# Patient Record
Sex: Male | Born: 2000 | Race: White | Hispanic: No | Marital: Single | Smoking: Current some day smoker
Health system: Southern US, Community
[De-identification: ages and names within clinical notes are randomized; demographics above are authoritative.]

## PROBLEM LIST (undated history)

## (undated) DIAGNOSIS — J302 Other seasonal allergic rhinitis: Secondary | ICD-10-CM

---
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
## 2018-09-24 ENCOUNTER — Emergency Department (HOSPITAL_COMMUNITY)
Admission: EM | Admit: 2018-09-24 | Discharge: 2018-09-24 | Disposition: A | Discharge status: Home / Self Care | Payer: BLUE CROSS/BLUE SHIELD | Attending: Emergency Medicine | Admitting: Emergency Medicine

## 2018-09-24 ENCOUNTER — Other Ambulatory Visit: Payer: Self-pay

## 2018-09-24 ENCOUNTER — Encounter (HOSPITAL_COMMUNITY): Payer: Self-pay | Admitting: Emergency Medicine

## 2018-09-24 DIAGNOSIS — Z23 Encounter for immunization: Secondary | ICD-10-CM | POA: Diagnosis not present

## 2018-09-24 DIAGNOSIS — X58XXXA Exposure to other specified factors, initial encounter: Secondary | ICD-10-CM | POA: Insufficient documentation

## 2018-09-24 DIAGNOSIS — T1592XA Foreign body on external eye, part unspecified, left eye, initial encounter: Secondary | ICD-10-CM | POA: Diagnosis present

## 2018-09-24 DIAGNOSIS — Z1810 Retained metal fragments, unspecified: Secondary | ICD-10-CM | POA: Insufficient documentation

## 2018-09-24 DIAGNOSIS — Y929 Unspecified place or not applicable: Secondary | ICD-10-CM | POA: Insufficient documentation

## 2018-09-24 DIAGNOSIS — Y999 Unspecified external cause status: Secondary | ICD-10-CM | POA: Insufficient documentation

## 2018-09-24 DIAGNOSIS — Z79899 Other long term (current) drug therapy: Secondary | ICD-10-CM | POA: Diagnosis not present

## 2018-09-24 DIAGNOSIS — Y9389 Activity, other specified: Secondary | ICD-10-CM | POA: Insufficient documentation

## 2018-09-24 MED ORDER — HYDROCODONE-ACETAMINOPHEN 5-325 MG PO TABS: 1.0000 | ORAL_TABLET | ORAL | 0 refills | Status: DC | PRN

## 2018-09-24 MED ORDER — FLUORESCEIN SODIUM 1 MG OP STRP
1.0000 | ORAL_STRIP | Freq: Once | OPHTHALMIC | Status: AC
  Administered 2018-09-24: 1 via OPHTHALMIC
  Filled 2018-09-24: qty 1

## 2018-09-24 MED ORDER — TETANUS-DIPHTH-ACELL PERTUSSIS 5-2.5-18.5 LF-MCG/0.5 IM SUSP
0.5000 mL | Freq: Once | INTRAMUSCULAR | Status: AC
  Administered 2018-09-24: 0.5 mL via INTRAMUSCULAR
  Filled 2018-09-24: qty 0.5

## 2018-09-24 MED ORDER — TETRACAINE HCL 0.5 % OP SOLN
2.0000 [drp] | Freq: Once | OPHTHALMIC | Status: AC
  Administered 2018-09-24: 2 [drp] via OPHTHALMIC
  Filled 2018-09-24: qty 4

## 2018-09-24 MED ORDER — TOBRAMYCIN 0.3 % OP SOLN: 1.0000 [drp] | OPHTHALMIC | 0 refills | Status: AC

## 2018-09-24 NOTE — ED Provider Notes (Signed)
[facility] EMERGENCY DEPARTMENT Provider Note   CSN: [id number] Arrival date & time: 09/24/18  1405     History   Chief Complaint Chief Complaint  Patient presents with  . Foreign Body in Eye    HPI Jason Wilkerson is a 18 y.o. male.  The history is provided by the patient. No language interpreter was used.  Foreign Body in Eye  This is a new problem. The problem occurs constantly. The problem has not changed since onset.Nothing aggravates the symptoms. Nothing relieves the symptoms. He has tried nothing for the symptoms. The treatment provided no relief.    Past Medical History:  Diagnosis Date  . Seasonal allergies     Patient Active Problem List   Diagnosis Date Noted  . Abnormal spirometry 10/06/2015  . Seasonal and perennial allergic rhinoconjunctivitis 05/15/2015    History reviewed. No pertinent surgical history.      Home Medications    Prior to Admission medications   Medication Sig Start Date End Date Taking? Authorizing Provider  Albuterol Sulfate (PROAIR RESPICLICK) 108 (90 Base) MCG/ACT AEPB Inhale 2 puffs into the lungs as needed.    [provider]  azelastine (ASTELIN) 0.1 % nasal spray Place 2 sprays into both nostrils daily. Use in each nostril as directed 10/06/15   Bobbitt, Heywood Iles, MD  Beclomethasone Dipropionate (QNASL) 80 MCG/ACT AERS Use 1 spray per nostril twice daily as needed 11/17/15   Bobbitt, Heywood Iles, MD  cetirizine (ZYRTEC) 10 MG tablet Take 10 mg by mouth daily. Reported on 11/17/2015    [provider]  diphenhydrAMINE (BENADRYL) 25 mg capsule Take 25 mg by mouth at bedtime as needed. Reported on 11/17/2015    [provider]  fluticasone (FLONASE) 50 MCG/ACT nasal spray Place 1 spray into both nostrils 2 (two) times daily. Reported on 11/17/2015    [provider]  HYDROcodone-acetaminophen (NORCO/VICODIN) 5-325 MG tablet Take 1 tablet by mouth every 4 (four) hours as needed. 09/24/18   Elson Areas, PA-C  ibuprofen (ADVIL,MOTRIN) 600 MG tablet Take 1 tablet (600 mg total) by mouth every 6 (six) hours as needed. 02/11/15   Ivery Quale, PA-C  levocetirizine (XYZAL) 5 MG tablet TAKE 1 TABLET BY MOUTH ONCE DAILY FOR RUNNY NOSE OR ITCHING 01/28/16   Bobbitt, Heywood Iles, MD  montelukast (SINGULAIR) 10 MG tablet Take 1 tablet (10 mg total) by mouth at bedtime. 10/06/15   Bobbitt, Heywood Iles, MD  tobramycin (TOBREX) 0.3 % ophthalmic solution Place 1 drop into the right eye every 4 (four) hours for 10 days. 09/24/18 10/04/18  Elson Areas, PA-C    Family History History reviewed. No pertinent family history.  Social History Social History   Tobacco Use  . Smoking status: Never Smoker  . Smokeless tobacco: Current User    Types: Snuff  Substance Use Topics  . Alcohol use: No    Alcohol/week: 0.0 standard drinks  . Drug use: No     Allergies   Augmentin [amoxicillin-pot clavulanate]   Review of Systems Review of Systems  All other systems reviewed and are negative.    Physical Exam Updated Vital Signs BP 138/88   Pulse 80   Temp 97.9 F (36.6 C) (Oral)   Resp 16   Ht 6' (1.829 m)   Wt 122.5 kg   SpO2 99%   BMI 36.62 kg/m   Physical Exam Vitals signs and nursing note reviewed.  Constitutional:      Appearance: He is well-developed.  HENT:     Head: Normocephalic.     Nose: Nose normal.     Mouth/Throat:     Mouth: Mucous membranes are moist.  Eyes:     Extraocular Movements: Extraocular movements intact.     Pupils: Pupils are equal, round, and reactive to light.     Comments: Small embedded foreign body 9oclock left eye    Neck:     Musculoskeletal: Normal range of motion.  Pulmonary:     Effort: Pulmonary effort is normal.  Abdominal:     General: There is no distension.  Musculoskeletal: Normal range of motion.  Neurological:     Mental Status: He is alert and oriented to person, place, and time.   Procedure tetracaine, fluro no uptake,    I attempted removal with qtip and 20 gauge.   I was unable to remove.   I spoke to Dr. Sherryll BurgerShah Opthomologist who will see tommorow at 7:30 am.    ED Treatments / Results  Labs (all labs ordered are listed, but only abnormal results are displayed) Labs Reviewed - No data to display  EKG None  Radiology No results found.  Procedures Procedures (including critical care time)  Medications Ordered in ED Medications  fluorescein ophthalmic strip 1 strip (has no administration in time range)  tetracaine (PONTOCAINE) 0.5 % ophthalmic solution 2 drop (2 drops Left Eye Given 09/24/18 1526)     Initial Impression / Assessment and Plan / ED Course  I have reviewed the triage vital signs and the nursing notes.  Pertinent labs & imaging results that were available during my care of the patient were reviewed by me and considered in my medical decision making (see chart for details).       Final Clinical Impressions(s) / ED Diagnoses   Final diagnoses:  Foreign body of left eye, initial encounter    ED Discharge Orders         Ordered    HYDROcodone-acetaminophen (NORCO/VICODIN) 5-325 MG tablet  Every 4 hours PRN     09/24/18 1637    tobramycin (TOBREX) 0.3 % ophthalmic solution  Every 4 hours     09/24/18 1637        An After Visit Summary was printed and given to the patient.    Elson AreasSofia,  K, [place]PA-C 09/24/18 1643    Eber HongMiller, Brian, MD 09/26/18 [phone]

## 2018-09-24 NOTE — ED Triage Notes (Signed)
Pt c/o of a piece of metal to his left eye.  Was working with metal this week, was not wearing goggles and felt something in his eye after.    Redness and pain noted to eye

## 2018-09-24 NOTE — Discharge Instructions (Signed)
See the Eye doctor tomorrow morning

## 2020-09-16 NOTE — Progress Notes (Unsigned)
[facility] [address] [place], [place] [address]  Pulmonary Sleep Medicine   Office Visit Note  Patient Name: Jason Wilkerson DOB: [birth year] MRN [id number]    Chief Complaint: Obstructive Sleep Apnea visit  Brief History:  Jason Wilkerson is seen today for *** The patient has a *** history of sleep apnea. Patient *** using PAP nightly.  The patient feels *** after sleeping with PAP.  The patient reports *** from PAP use. Reported sleepiness is  *** and the Epworth Sleepiness Score is *** out of 24. The patient *** take naps. The patient complains of the following: ***  The compliance download shows  compliance with an average use time of *** hours. The AHI is ***  The patient *** of limb movements disrupting sleep.  ROS  General: (-) fever, (-) chills, (-) night sweat Nose and Sinuses: (-) nasal stuffiness or itchiness, (-) postnasal drip, (-) nosebleeds, (-) sinus trouble. Mouth and Throat: (-) sore throat, (-) hoarseness. Neck: (-) swollen glands, (-) enlarged thyroid, (-) neck pain. Respiratory: *** cough, *** shortness of breath, *** wheezing. Neurologic: *** numbness, *** tingling. Psychiatric: *** anxiety, *** depression   Current Medication: Outpatient Encounter Medications as of 09/17/2020  Medication Sig  . Albuterol Sulfate (PROAIR RESPICLICK) 108 (90 Base) MCG/ACT AEPB Inhale 2 puffs into the lungs as needed.  Marland Kitchen azelastine (ASTELIN) 0.1 % nasal spray Place 2 sprays into both nostrils daily. Use in each nostril as directed  . Beclomethasone Dipropionate (QNASL) 80 MCG/ACT AERS Use 1 spray per nostril twice daily as needed  . cetirizine (ZYRTEC) 10 MG tablet Take 10 mg by mouth daily. Reported on 11/17/2015  . diphenhydrAMINE (BENADRYL) 25 mg capsule Take 25 mg by mouth at bedtime as needed. Reported on 11/17/2015  . fluticasone (FLONASE) 50 MCG/ACT nasal spray Place 1 spray into both nostrils 2 (two) times daily. Reported on 11/17/2015  . HYDROcodone-acetaminophen  (NORCO/VICODIN) 5-325 MG tablet Take 1 tablet by mouth every 4 (four) hours as needed.  Marland Kitchen ibuprofen (ADVIL,MOTRIN) 600 MG tablet Take 1 tablet (600 mg total) by mouth every 6 (six) hours as needed.  Marland Kitchen levocetirizine (XYZAL) 5 MG tablet TAKE 1 TABLET BY MOUTH ONCE DAILY FOR RUNNY NOSE OR ITCHING  . montelukast (SINGULAIR) 10 MG tablet Take 1 tablet (10 mg total) by mouth at bedtime.   No facility-administered encounter medications on file as of 09/17/2020.    Surgical History: No past surgical history on file.  Medical History: Past Medical History:  Diagnosis Date  . Seasonal allergies     Family History: Non contributory to the present illness  Social History: Social History   Socioeconomic History  . Marital status: Single    Spouse name: Not on file  . Number of children: Not on file  . Years of education: Not on file  . Highest education level: Not on file  Occupational History  . Not on file  Tobacco Use  . Smoking status: Never Smoker  . Smokeless tobacco: Current User    Types: Snuff  Substance and Sexual Activity  . Alcohol use: No    Alcohol/week: 0.0 standard drinks  . Drug use: No  . Sexual activity: Not on file  Other Topics Concern  . Not on file  Social History Narrative  . Not on file   Social Determinants of Health   Financial Resource Strain: Not on file  Food Insecurity: Not on file  Transportation Needs: Not on file  Physical Activity: Not on file  Stress: Not on file  Social Connections: Not on file  Intimate Partner Violence: Not on file    Vital Signs: There were no vitals taken for this visit.  Examination: General Appearance: The patient is well-developed, well-nourished, and in no distress. Neck Circumference: *** Skin: Gross inspection of skin unremarkable. Head: normocephalic, no gross deformities. Eyes: no gross deformities noted. ENT: ears appear grossly normal Neurologic: Alert and oriented. No involuntary  movements.    EPWORTH SLEEPINESS SCALE:  Scale:  (0)= no chance of dozing; (1)= slight chance of dozing; (2)= moderate chance of dozing; (3)= high chance of dozing  Chance  Situtation    Sitting and reading: ***    Watching TV: ***    Sitting Inactive in public: ***    As a passenger in car: ***      Lying down to rest: ***    Sitting and talking: ***    Sitting quielty after lunch: ***    In a car, stopped in traffic: ***   TOTAL SCORE:   *** out of 24    SLEEP STUDIES:  1. HST 12/09/09 AHI 23 Spo87min 96%  CPAP COMPLIANCE DATA:  Date Range: ***  Average Daily Use: *** hours  Median Use: ***  Compliance for > 4 Hours: *** days  AHI: *** respiratory events per hour  Days Used: ***  Mask Leak: ***  95th Percentile Pressure: ***         LABS: No results found for this or any previous visit (from the past 2160 hour(s)).  Radiology: No results found.  No results found.  No results found.    Assessment and Plan: Patient Active Problem List   Diagnosis Date Noted  . Abnormal spirometry 10/06/2015  . Seasonal and perennial allergic rhinoconjunctivitis 05/15/2015      The patient *** tolerate PAP and reports *** benefit from PAP use. The patient was reminded how to *** and advised to ***. The patient was also counselled on ***. The compliance is ***. The AHI is ***.   1. ***  General Counseling: I have discussed the findings of the evaluation and examination with Jason Wilkerson.  I have also discussed any further diagnostic evaluation thatmay be needed or ordered today. Jason Wilkerson verbalizes understanding of the findings of todays visit. We also reviewed his medications today and discussed drug interactions and side effects including but not limited excessive drowsiness and altered mental states. We also discussed that there is always a risk not just to him but also people around him. he has been encouraged to call the office with any questions or  concerns that should arise related to todays visit.  No orders of the defined types were placed in this encounter.       I have personally obtained a history, examined the patient, evaluated laboratory and imaging results, formulated the assessment and plan and placed orders.   Valentino Hue Sol Blazing, PhD, FAASM  Diplomate, American Board of Sleep Medicine    Yevonne Pax, MD [facility] Diplomate ABMS Pulmonary and Critical Care Medicine Sleep medicine

## 2020-09-17 ENCOUNTER — Ambulatory Visit: Payer: BLUE CROSS/BLUE SHIELD

## 2023-05-15 ENCOUNTER — Emergency Department (HOSPITAL_COMMUNITY)
Admission: EM | Admit: 2023-05-15 | Discharge: 2023-05-15 | Disposition: A | Discharge status: Home / Self Care | Payer: 59 | Attending: Emergency Medicine | Admitting: Emergency Medicine

## 2023-05-15 ENCOUNTER — Emergency Department (HOSPITAL_COMMUNITY)
Admission: EM | Admit: 2023-05-15 | Discharge: 2023-05-15 | Disposition: A | Discharge status: Home / Self Care | Payer: 59 | Source: Home / Self Care | Attending: Emergency Medicine | Admitting: Emergency Medicine

## 2023-05-15 ENCOUNTER — Encounter (HOSPITAL_COMMUNITY): Payer: Self-pay

## 2023-05-15 ENCOUNTER — Emergency Department (HOSPITAL_COMMUNITY): Payer: 59

## 2023-05-15 ENCOUNTER — Other Ambulatory Visit: Payer: Self-pay

## 2023-05-15 DIAGNOSIS — S161XXA Strain of muscle, fascia and tendon at neck level, initial encounter: Secondary | ICD-10-CM | POA: Insufficient documentation

## 2023-05-15 DIAGNOSIS — Y905 Blood alcohol level of 100-119 mg/100 ml: Secondary | ICD-10-CM | POA: Insufficient documentation

## 2023-05-15 DIAGNOSIS — D72829 Elevated white blood cell count, unspecified: Secondary | ICD-10-CM | POA: Insufficient documentation

## 2023-05-15 DIAGNOSIS — Z5189 Encounter for other specified aftercare: Secondary | ICD-10-CM | POA: Insufficient documentation

## 2023-05-15 DIAGNOSIS — S20412A Abrasion of left back wall of thorax, initial encounter: Secondary | ICD-10-CM | POA: Insufficient documentation

## 2023-05-15 DIAGNOSIS — R509 Fever, unspecified: Secondary | ICD-10-CM | POA: Diagnosis not present

## 2023-05-15 DIAGNOSIS — S0101XA Laceration without foreign body of scalp, initial encounter: Secondary | ICD-10-CM | POA: Insufficient documentation

## 2023-05-15 DIAGNOSIS — Z1152 Encounter for screening for COVID-19: Secondary | ICD-10-CM | POA: Insufficient documentation

## 2023-05-15 DIAGNOSIS — S0990XA Unspecified injury of head, initial encounter: Secondary | ICD-10-CM | POA: Diagnosis present

## 2023-05-15 LAB — CBC WITH DIFFERENTIAL/PLATELET
Abs Immature Granulocytes: 0.04 10*3/uL (ref 0.00–0.07)
Basophils Absolute: 0 10*3/uL (ref 0.0–0.1)
Basophils Relative: 0 %
Eosinophils Absolute: 0.2 10*3/uL (ref 0.0–0.5)
Eosinophils Relative: 1 %
HCT: 43.4 % (ref 39.0–52.0)
Hemoglobin: 15.1 g/dL (ref 13.0–17.0)
Immature Granulocytes: 0 %
Lymphocytes Relative: 25 %
Lymphs Abs: 3.1 10*3/uL (ref 0.7–4.0)
MCH: 32.3 pg (ref 26.0–34.0)
MCHC: 34.8 g/dL (ref 30.0–36.0)
MCV: 92.9 fL (ref 80.0–100.0)
Monocytes Absolute: 1.2 10*3/uL — ABNORMAL HIGH (ref 0.1–1.0)
Monocytes Relative: 10 %
Neutro Abs: 8 10*3/uL — ABNORMAL HIGH (ref 1.7–7.7)
Neutrophils Relative %: 64 %
Platelets: 247 10*3/uL (ref 150–400)
RBC: 4.67 MIL/uL (ref 4.22–5.81)
RDW: 13 % (ref 11.5–15.5)
WBC: 12.6 10*3/uL — ABNORMAL HIGH (ref 4.0–10.5)
nRBC: 0 % (ref 0.0–0.2)

## 2023-05-15 LAB — COMPREHENSIVE METABOLIC PANEL
ALT: 20 U/L (ref 0–44)
AST: 21 U/L (ref 15–41)
Albumin: 3.9 g/dL (ref 3.5–5.0)
Alkaline Phosphatase: 68 U/L (ref 38–126)
Anion gap: 9 (ref 5–15)
BUN: 15 mg/dL (ref 6–20)
CO2: 23 mmol/L (ref 22–32)
Calcium: 8.9 mg/dL (ref 8.9–10.3)
Chloride: 103 mmol/L (ref 98–111)
Creatinine, Ser: 1.08 mg/dL (ref 0.61–1.24)
GFR, Estimated: >60 mL/min (ref >60)
Glucose, Bld: 113 mg/dL — ABNORMAL HIGH (ref 70–99)
Potassium: 3.8 mmol/L (ref 3.5–5.1)
Sodium: 135 mmol/L (ref 135–145)
Total Bilirubin: 0.9 mg/dL (ref 0.3–1.2)
Total Protein: 7.3 g/dL (ref 6.5–8.1)

## 2023-05-15 LAB — URINALYSIS, ROUTINE W REFLEX MICROSCOPIC
Bacteria, UA: NONE SEEN
Bilirubin Urine: NEGATIVE
Glucose, UA: NEGATIVE mg/dL
Hgb urine dipstick: NEGATIVE
Ketones, ur: 5 mg/dL — AB
Leukocytes,Ua: NEGATIVE
Nitrite: NEGATIVE
Protein, ur: 30 mg/dL — AB
Specific Gravity, Urine: 1.034 — ABNORMAL HIGH (ref 1.005–1.030)
pH: 6 (ref 5.0–8.0)

## 2023-05-15 LAB — CBC
HCT: 42.2 % (ref 39.0–52.0)
Hemoglobin: 14.3 g/dL (ref 13.0–17.0)
MCH: 31.6 pg (ref 26.0–34.0)
MCHC: 33.9 g/dL (ref 30.0–36.0)
MCV: 93.4 fL (ref 80.0–100.0)
Platelets: 238 10*3/uL (ref 150–400)
RBC: 4.52 MIL/uL (ref 4.22–5.81)
RDW: 12.7 % (ref 11.5–15.5)
WBC: 12.6 10*3/uL — ABNORMAL HIGH (ref 4.0–10.5)
nRBC: 0 % (ref 0.0–0.2)

## 2023-05-15 LAB — BASIC METABOLIC PANEL
Anion gap: 12 (ref 5–15)
BUN: 11 mg/dL (ref 6–20)
CO2: 21 mmol/L — ABNORMAL LOW (ref 22–32)
Calcium: 8.9 mg/dL (ref 8.9–10.3)
Chloride: 103 mmol/L (ref 98–111)
Creatinine, Ser: 1.12 mg/dL (ref 0.61–1.24)
GFR, Estimated: >60 mL/min (ref >60)
Glucose, Bld: 122 mg/dL — ABNORMAL HIGH (ref 70–99)
Potassium: 3.5 mmol/L (ref 3.5–5.1)
Sodium: 136 mmol/L (ref 135–145)

## 2023-05-15 LAB — ETHANOL: Alcohol, Ethyl (B): 119 mg/dL — ABNORMAL HIGH (ref <10)

## 2023-05-15 LAB — SARS CORONAVIRUS 2 BY RT PCR: SARS Coronavirus 2 by RT PCR: NEGATIVE

## 2023-05-15 MED ORDER — DOXYCYCLINE HYCLATE 100 MG PO CAPS: 100.0000 mg | ORAL_CAPSULE | Freq: Two times a day (BID) | ORAL | 0 refills | Status: AC

## 2023-05-15 MED ORDER — METHOCARBAMOL 500 MG PO TABS: 500.0000 mg | ORAL_TABLET | Freq: Three times a day (TID) | ORAL | 0 refills | Status: AC | PRN

## 2023-05-15 MED ORDER — IBUPROFEN 800 MG PO TABS: 800.0000 mg | ORAL_TABLET | Freq: Four times a day (QID) | ORAL | 0 refills | Status: AC | PRN

## 2023-05-15 MED ORDER — LIDOCAINE-EPINEPHRINE (PF) 2 %-1:200000 IJ SOLN
10.0000 mL | Freq: Once | INTRAMUSCULAR | Status: AC
  Administered 2023-05-15: 10 mL
  Filled 2023-05-15: qty 20
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
## 2023-05-15 NOTE — ED Notes (Signed)
Pt in NAD at d/c. A&O. Ambulatory. Respirations even & unlabored. Skin warm & dry. Pt & family verbalized understanding of d/c teaching including follow up care, medications, wound care, and reasons to return to the ED. Discharged with family to drive pt home.
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
## 2023-05-15 NOTE — ED Notes (Signed)
Patient transported to CT 
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
## 2023-05-15 NOTE — Discharge Instructions (Signed)
Go to urgent care in 10 days to have the staples removed
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
## 2023-05-15 NOTE — Discharge Instructions (Addendum)
Please return to the ER immediately if you develop any kind of nausea, vomiting, headache, vision loss, or weakness on one side of the body.  Additionally return if you have any drainage, or purulent drainage from your head or other wounds.  I placed you on doxycycline, for possible infection return if your symptoms are worsening.  Keep the area wound clean and dry, and rinse it with water when showering.

## 2023-05-15 NOTE — ED Notes (Signed)
Patient returned from CT at this time with this RN & Primary RN on cardiac monitor.
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
